# Patient Record
Sex: Male | Born: 1995 | Race: Black or African American | Hispanic: No | Marital: Single | Smoking: Never smoker
Health system: Southern US, Community
[De-identification: ages and names within clinical notes are randomized; demographics above are authoritative.]

## PROBLEM LIST (undated history)

## (undated) DIAGNOSIS — D649 Anemia, unspecified: Secondary | ICD-10-CM

## (undated) HISTORY — DX: Anemia, unspecified: D64.9

---
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
## 2015-06-07 ENCOUNTER — Other Ambulatory Visit: Payer: Self-pay | Admitting: Family Medicine

## 2015-06-07 ENCOUNTER — Ambulatory Visit
Admission: RE | Admit: 2015-06-07 | Discharge: 2015-06-07 | Disposition: A | Discharge status: Home / Self Care | Payer: 59 | Source: Ambulatory Visit | Attending: Family Medicine | Admitting: Family Medicine

## 2015-06-07 DIAGNOSIS — M25511 Pain in right shoulder: Secondary | ICD-10-CM | POA: Insufficient documentation

## 2015-06-07 DIAGNOSIS — R609 Edema, unspecified: Secondary | ICD-10-CM

## 2015-06-07 DIAGNOSIS — R52 Pain, unspecified: Secondary | ICD-10-CM

## 2015-08-31 DIAGNOSIS — Z87820 Personal history of traumatic brain injury: Secondary | ICD-10-CM | POA: Insufficient documentation

## 2015-08-31 DIAGNOSIS — J4531 Mild persistent asthma with (acute) exacerbation: Secondary | ICD-10-CM | POA: Insufficient documentation

## 2016-06-09 IMAGING — CR DG AC JOINTS*L*
1 series · 4 of 4 positions shown · non-contrast
Comparison: None

CLINICAL DATA: Right AC joint pain and swelling from ring following
an injury during football game 3 days ago.

EXAM:
LEFT ACROMIOCLAVICULAR JOINTS

[Series 1: dg ac joints · 0.14mm/px · 4 of 4 slices shown]
[im 1/4]
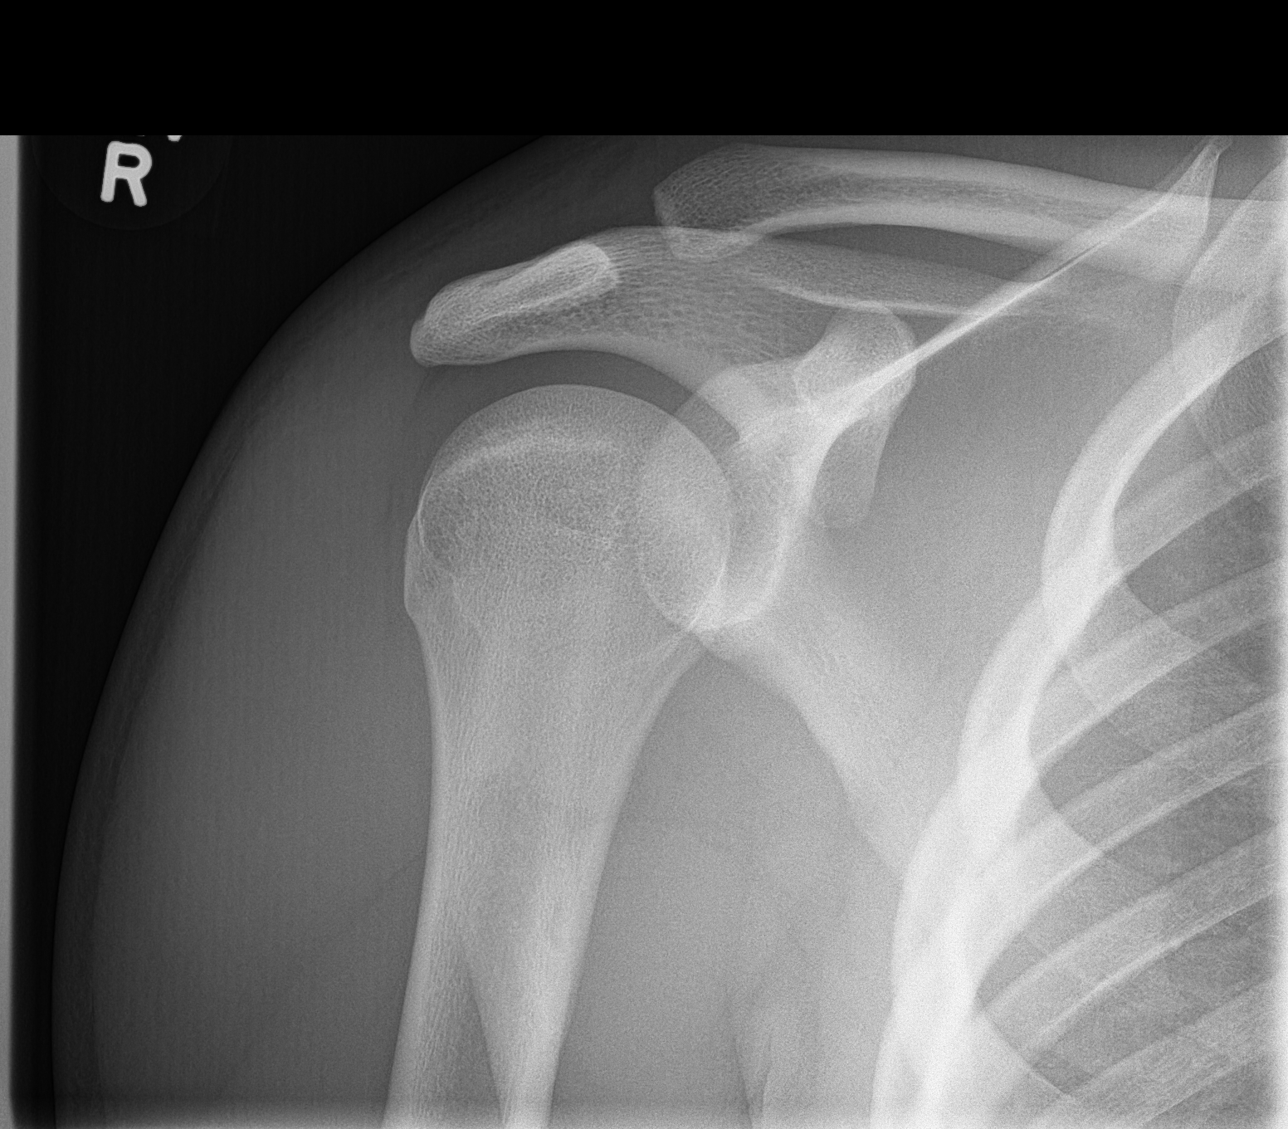
[im 2/4]
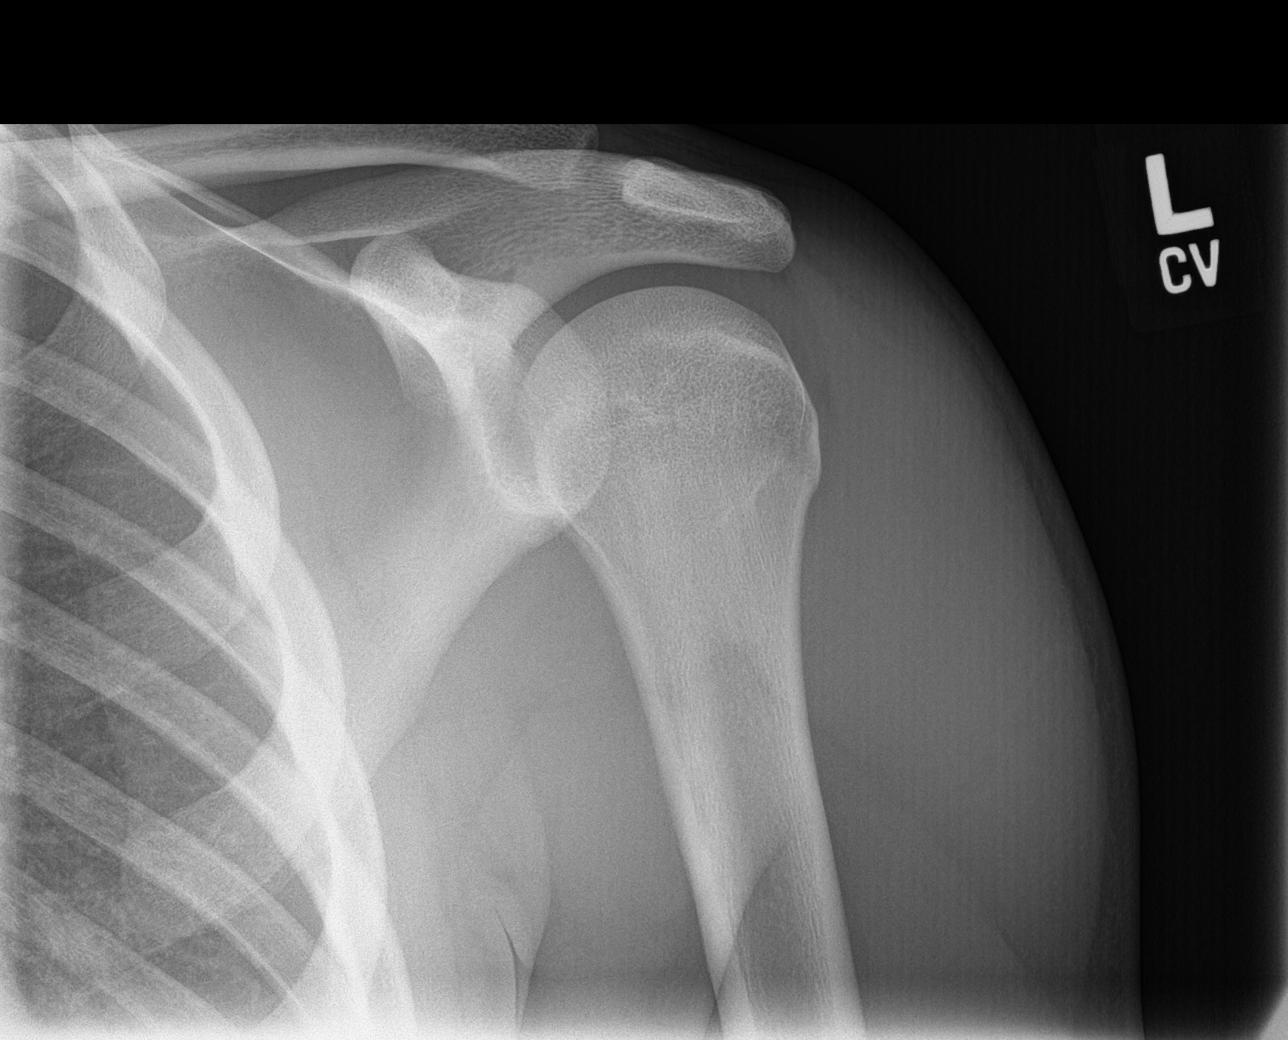
[im 3/4]
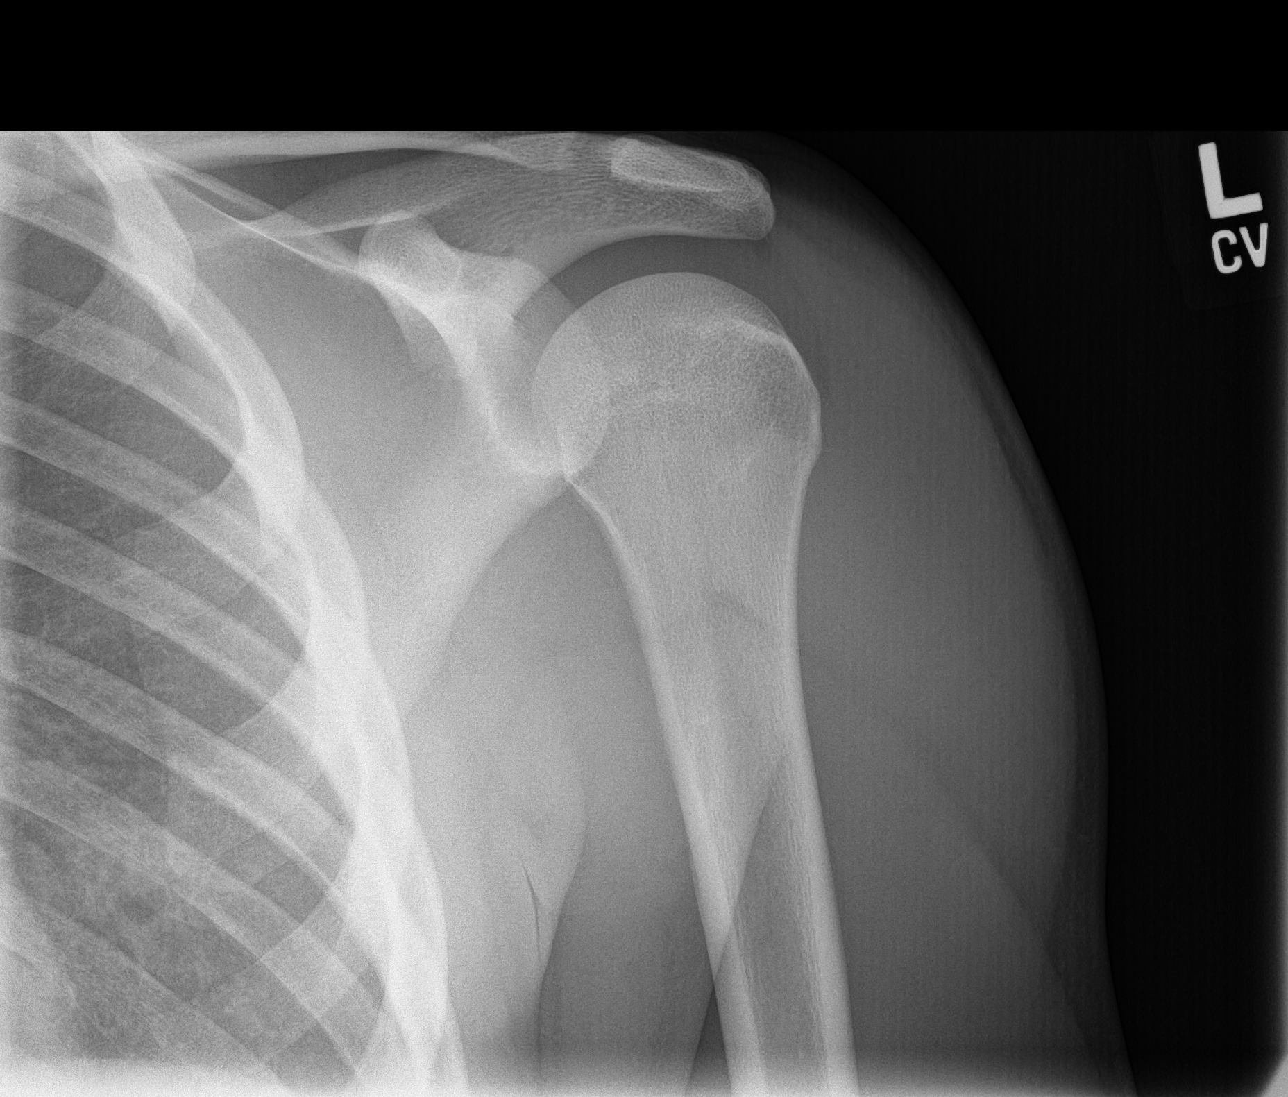
[im 4/4]
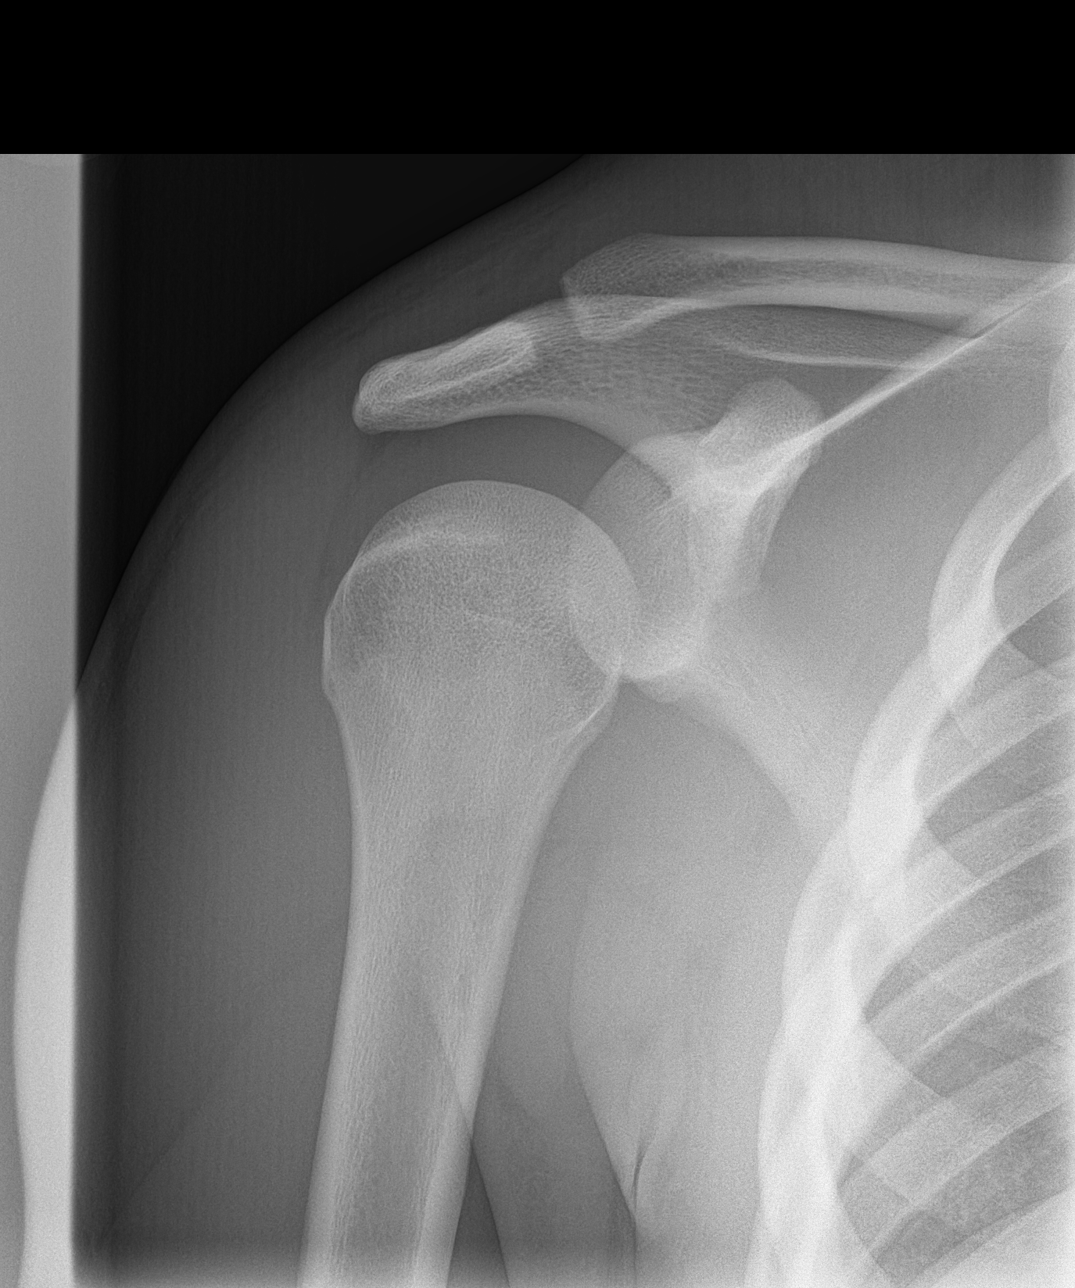

[4 of 4 positions shown; findings below may reference images not displayed]

FINDINGS: Images obtained without and with weights are reviewed. There is mild
widening of the right AC joint as compared to the left. However, no
significant change in the width without and with weights is
observed. The visualized portions of the right clavicle, of the
acromion, and remainder of the scapula are normal. The humeral head
exhibits no acute abnormality. The left AC joint is unremarkable.
IMPRESSION: There is mild widening of the right AC joint which may reflect a
sprain. There is no instability on the without and with weight
views. No acute fracture is observed.

## 2017-02-14 ENCOUNTER — Encounter: Payer: Self-pay | Admitting: Family Medicine

## 2017-02-14 ENCOUNTER — Ambulatory Visit (INDEPENDENT_AMBULATORY_CARE_PROVIDER_SITE_OTHER): Payer: 59 | Admitting: Family Medicine

## 2017-02-14 VITALS — BP 129/61 | HR 82 | Temp 98.7°F

## 2017-02-14 DIAGNOSIS — R0683 Snoring: Secondary | ICD-10-CM

## 2017-02-14 NOTE — Progress Notes (Signed)
Patient presents today with history of snoring and possible apneic spells for the last few years. Patient states that his mother and father both have sleep apnea. They have noticed him snoring very loud at times. Patient does state that he does feel tired throughout the day sometimes, admitting not having a restful sleep. He states he has no problem falling asleep. He does not take any sleep agents. He does however notice waking up during the night at times. He states that since she has been at [facility] he has had probably 10-15 pound weight gain. Patient does play football. He does admit to having seasonal allergies and asthma. He does takes Zyrtec and does use his albuterol inhaler at least once daily. He denies any history of any nasal fractures are chronic sinus problems.  ROS: Negative except mentioned above. Negative except mentioned above. GENERAL: NAD HEENT: large tongue and mildly large neck size, no pharyngeal erythema, no exudate, no erythema of TMs, no cervical LAD RESP: CTA B CARD: RRR NEURO: CN II-XII grossly intact   A/P: Snoring, family history of sleep apnea - will have patient see Pulmonary/Sleep to evaluate further and to decide on whether patient needs a sleep study. Patient will seek medical attention if has any acute problems. Patient appreciative and has no further questions or concerns.

## 2017-02-18 ENCOUNTER — Encounter: Payer: Self-pay | Admitting: *Deleted

## 2017-02-18 ENCOUNTER — Telehealth: Payer: Self-pay

## 2017-02-18 ENCOUNTER — Other Ambulatory Visit: Payer: Self-pay | Admitting: *Deleted

## 2017-02-18 NOTE — Telephone Encounter (Signed)
Appointment has been [facility] and Dr. Eliane DecreePatel's office.

## 2017-02-18 NOTE — Telephone Encounter (Signed)
Sheena from [facility] with Dr Eliane DecreePatel's office called stating they were told by Dr Belia HemanKasa that we could see patient this Friday for sleep apnea consult   Please call back they would know when patient can come in and see Dr Belia HemanKasa

## 2017-02-21 ENCOUNTER — Encounter: Payer: Self-pay | Admitting: *Deleted

## 2017-02-21 ENCOUNTER — Other Ambulatory Visit: Payer: Self-pay | Admitting: *Deleted

## 2017-02-21 DIAGNOSIS — M5430 Sciatica, unspecified side: Secondary | ICD-10-CM | POA: Insufficient documentation

## 2017-02-21 DIAGNOSIS — R279 Unspecified lack of coordination: Secondary | ICD-10-CM | POA: Insufficient documentation

## 2017-02-21 DIAGNOSIS — M25469 Effusion, unspecified knee: Secondary | ICD-10-CM | POA: Insufficient documentation

## 2017-02-21 DIAGNOSIS — S83509A Sprain of unspecified cruciate ligament of unspecified knee, initial encounter: Secondary | ICD-10-CM | POA: Insufficient documentation

## 2017-02-21 DIAGNOSIS — M542 Cervicalgia: Secondary | ICD-10-CM | POA: Insufficient documentation

## 2017-02-21 DIAGNOSIS — M2340 Loose body in knee, unspecified knee: Secondary | ICD-10-CM | POA: Insufficient documentation

## 2017-02-21 DIAGNOSIS — M4802 Spinal stenosis, cervical region: Secondary | ICD-10-CM | POA: Insufficient documentation

## 2017-02-21 DIAGNOSIS — M7650 Patellar tendinitis, unspecified knee: Secondary | ICD-10-CM | POA: Insufficient documentation

## 2017-02-21 DIAGNOSIS — M5020 Other cervical disc displacement, unspecified cervical region: Secondary | ICD-10-CM | POA: Insufficient documentation

## 2017-02-21 DIAGNOSIS — M25673 Stiffness of unspecified ankle, not elsewhere classified: Secondary | ICD-10-CM | POA: Insufficient documentation

## 2017-02-21 DIAGNOSIS — M23329 Other meniscus derangements, posterior horn of medial meniscus, unspecified knee: Secondary | ICD-10-CM | POA: Insufficient documentation

## 2017-02-21 DIAGNOSIS — M6281 Muscle weakness (generalized): Secondary | ICD-10-CM | POA: Insufficient documentation

## 2017-02-21 DIAGNOSIS — M79609 Pain in unspecified limb: Secondary | ICD-10-CM | POA: Insufficient documentation

## 2017-02-21 DIAGNOSIS — M5412 Radiculopathy, cervical region: Secondary | ICD-10-CM | POA: Insufficient documentation

## 2017-02-21 DIAGNOSIS — M25569 Pain in unspecified knee: Secondary | ICD-10-CM | POA: Insufficient documentation

## 2017-02-21 DIAGNOSIS — S93439A Sprain of tibiofibular ligament of unspecified ankle, initial encounter: Secondary | ICD-10-CM | POA: Insufficient documentation

## 2017-02-21 DIAGNOSIS — M25519 Pain in unspecified shoulder: Secondary | ICD-10-CM | POA: Insufficient documentation

## 2017-02-21 DIAGNOSIS — R609 Edema, unspecified: Secondary | ICD-10-CM | POA: Insufficient documentation

## 2017-02-21 DIAGNOSIS — S83419A Sprain of medial collateral ligament of unspecified knee, initial encounter: Secondary | ICD-10-CM | POA: Insufficient documentation

## 2017-02-21 DIAGNOSIS — M24419 Recurrent dislocation, unspecified shoulder: Secondary | ICD-10-CM | POA: Insufficient documentation

## 2017-02-21 DIAGNOSIS — M25579 Pain in unspecified ankle and joints of unspecified foot: Secondary | ICD-10-CM | POA: Insufficient documentation

## 2017-02-22 ENCOUNTER — Encounter: Payer: Self-pay | Admitting: Internal Medicine

## 2017-02-22 ENCOUNTER — Other Ambulatory Visit: Payer: Self-pay | Admitting: *Deleted

## 2017-02-22 ENCOUNTER — Ambulatory Visit (INDEPENDENT_AMBULATORY_CARE_PROVIDER_SITE_OTHER): Payer: 59 | Admitting: Internal Medicine

## 2017-02-22 VITALS — BP 140/80 | HR 57 | Resp 16 | Ht 71.0 in | Wt 232.0 lb

## 2017-02-22 DIAGNOSIS — J4520 Mild intermittent asthma, uncomplicated: Secondary | ICD-10-CM

## 2017-02-22 DIAGNOSIS — G4719 Other hypersomnia: Secondary | ICD-10-CM | POA: Diagnosis not present

## 2017-02-22 NOTE — Addendum Note (Signed)
Addended by: Alease FrameARTER, SONYA S on: 02/22/2017 12:15 PM   Modules accepted: Orders

## 2017-02-22 NOTE — Progress Notes (Signed)
Name: Austin Holland MRN: [id number] DOB: Apr 29, 1996     CONSULTATION DATE: 02/22/2017  REFERRING MD :  Allena Katz  CHIEF COMPLAINT:  Excessive daytime sleepiness     HISTORY OF PRESENT ILLNESS:  21 year old pleasant African-American male seen today for loud snoring and excessive daytime sleepiness  seen today for problems with sleep Patient has been having excessive daytime sleepiness Patient has been having extreme fatigue and tiredness, lack of energy Patient has been told that she has very  Loud snoring every night as per family Patient has been told that she struggles to breathe at night and gasps for air as per family  Patient is an Pensions consultant Patient weighs 225 pounds which has increased over the last couple years Patient has 17.5 inch neck  Epworth sleep score is 10  Patient also has a history of childhood asthma Patient is well controlled on his current medications of Flovent Patient also has a diagnosis of allergic rhinitis which is controlled by antihistamines and nasal spray Last nebulizer or rescue inhaler use was approximately one month ago Patient states he had prednisone approximately 2 years ago Patient denies having any eczema  No acute signs of asthma exacerbation at this time  no acute signs of infection at this time  no acute signs of heart failure at this time   PAST MEDICAL HISTORY :   has a past medical history of Anemia.  has no past surgical history on file. Prior to Admission medications   Medication Sig Start Date End Date Taking? Authorizing Provider  albuterol (ACCUNEB) 0.63 MG/3ML nebulizer solution Take 1 ampule by nebulization every 6 (six) hours as needed for wheezing.    [provider]  albuterol (PROVENTIL HFA;VENTOLIN HFA) 108 (90 Base) MCG/ACT inhaler Inhale 2 puffs into the lungs every 4 (four) hours as needed. 04/23/16   [provider]  cetirizine (ZYRTEC) 10 MG tablet Take 10 mg by mouth daily.     [provider]  fluticasone (FLONASE) 50 MCG/ACT nasal spray Place 1 spray into both nostrils 2 (two) times daily.    [provider]  fluticasone (FLOVENT HFA) 44 MCG/ACT inhaler Inhale 2 puffs into the lungs 2 (two) times daily.    [provider]   No Known Allergies  FAMILY HISTORY:  family history includes Asthma in his sister; Diabetes in his father, maternal grandmother, and mother; Kidney disease in his mother. SOCIAL HISTORY:  reports that he has quit smoking. His smoking use included Cigars. He has never used smokeless tobacco. He reports that he drinks alcohol. He reports that he does not use drugs.  REVIEW OF SYSTEMS:   Constitutional: Negative for fever, chills, weight loss,+ malaise/fatigue and diaphoresis.  HENT: Negative for hearing loss, ear pain, nosebleeds, congestion, sore throat, neck pain, tinnitus and ear discharge.   Eyes: Negative for blurred vision, double vision, photophobia, pain, discharge and redness.  Respiratory: Negative for cough, hemoptysis, sputum production, shortness of breath, wheezing and stridor.   Cardiovascular: Negative for chest pain, palpitations, orthopnea, claudication, leg swelling and PND.  Gastrointestinal: Negative for heartburn, nausea, vomiting, abdominal pain, diarrhea, constipation, blood in stool and melena.  Genitourinary: Negative for dysuria, urgency, frequency, hematuria and flank pain.  Musculoskeletal: Negative for myalgias, back pain, joint pain and falls.  Skin: Negative for itching and rash.  Neurological: Negative for dizziness, tingling, tremors, sensory change, speech change, focal weakness, seizures, loss of consciousness, weakness and headaches.  Endo/Heme/Allergies: Negative for environmental allergies and polydipsia. Does not bruise/bleed  easily.  ALL OTHER ROS ARE NEGATIVE   BP 140/80 (BP Location: Left Arm, Patient Position: Sitting, Cuff Size: Large)   Pulse (!) 57   Resp 16   Ht 5'  11" (1.803 m)   Wt 232 lb (105.2 kg)   SpO2 100%   BMI 32.36 kg/m    Physical Examination:   GENERAL:NAD, no fevers, chills, no weakness no fatigue HEAD: Normocephalic, atraumatic.  EYES: Pupils equal, round, reactive to light. Extraocular muscles intact. No scleral icterus.  MOUTH: Moist mucosal membrane.   EAR, NOSE, THROAT: Clear without exudates. No external lesions.  NECK: Supple. No thyromegaly. No nodules. No JVD.  PULMONARY:CTA B/L no wheezes, no crackles, no rhonchi CARDIOVASCULAR: S1 and S2. Regular rate and rhythm. No murmurs, rubs, or gallops. No edema.  GASTROINTESTINAL: Soft, nontender, nondistended. No masses. Positive bowel sounds.  MUSCULOSKELETAL: No swelling, clubbing, or edema. Range of motion full in all extremities.  NEUROLOGIC: Cranial nerves II through XII are intact. No gross focal neurological deficits.  SKIN: No ulceration, lesions, rashes, or cyanosis. Skin warm and dry. Turgor intact.  PSYCHIATRIC: Mood, affect within normal limits. The patient is awake, alert and oriented x 3. Insight, judgment intact.     No chest x-rays or CAT scans to be in reviewed   ASSESSMENT / PLAN: [age] year old pleasant African-American male seen today for excessive daytime sleepiness in the setting of loud snoring and respiratory difficulty at nighttime in the setting of well-controlled mild intermittent asthma associated with allergic rhinitis  #1 excessive daytime sleepiness and difficulty breathing at night Findings suggest high likelihood of obstructive sleep apnea Patient will need sleep study ASAP to confirm diagnosis  #2 mild intermittent asthma His asthma is well under control at this time with inhaled steroids-recommend continuing as prescribed Albuterol and nebulizer treatment as needed Avoid allergens at all costs  #3 allergic rhinitis Seems to be under control with antihistamines and steroid nasal spray  Patient satisfied with Plan of action and management.  All questions answered Follow-up after sleep study obtained  Lucie LeatherKurian David Marycruz Boehner, M.D.  [facility]  Medical Director [facility] Medical Director [facility]

## 2017-02-22 NOTE — Patient Instructions (Signed)
Patient will need sleep study to assess for sleep apnea

## 2017-03-26 ENCOUNTER — Encounter: Payer: Self-pay | Admitting: Internal Medicine

## 2017-03-26 DIAGNOSIS — G4719 Other hypersomnia: Secondary | ICD-10-CM

## 2017-03-30 DIAGNOSIS — G4733 Obstructive sleep apnea (adult) (pediatric): Secondary | ICD-10-CM | POA: Diagnosis not present

## 2017-04-03 ENCOUNTER — Other Ambulatory Visit: Payer: Self-pay | Admitting: Internal Medicine

## 2017-04-03 ENCOUNTER — Telehealth: Payer: Self-pay | Admitting: Internal Medicine

## 2017-04-03 DIAGNOSIS — G4733 Obstructive sleep apnea (adult) (pediatric): Secondary | ICD-10-CM

## 2017-04-03 NOTE — Telephone Encounter (Signed)
Returned call to patient he was not available. Patient did return call and wanted to know stage of OSA. Patient informed mild sleep apnea. Patient had no further question.

## 2017-04-03 NOTE — Telephone Encounter (Signed)
Pt would like a call to discuss sleep study results. He has a question.

## 2017-04-03 NOTE — Progress Notes (Signed)
Patient notified of results of sleep study. Orders entered. Pt made aware that he needs to call office and schedule f/u appt once machine is picked up. Nothing further needed.

## 2017-04-23 ENCOUNTER — Encounter: Payer: Self-pay | Admitting: Internal Medicine

## 2017-05-13 ENCOUNTER — Encounter: Payer: Self-pay | Admitting: Internal Medicine

## 2017-05-16 ENCOUNTER — Encounter: Payer: Self-pay | Admitting: Internal Medicine

## 2017-06-21 ENCOUNTER — Encounter: Payer: Self-pay | Admitting: Family Medicine

## 2017-06-21 ENCOUNTER — Ambulatory Visit (INDEPENDENT_AMBULATORY_CARE_PROVIDER_SITE_OTHER): Payer: 59 | Admitting: Family Medicine

## 2017-06-21 VITALS — Temp 98.9°F

## 2017-06-21 DIAGNOSIS — L089 Local infection of the skin and subcutaneous tissue, unspecified: Secondary | ICD-10-CM

## 2017-06-21 MED ORDER — SULFAMETHOXAZOLE-TRIMETHOPRIM 800-160 MG PO TABS: 1.0000 | ORAL_TABLET | Freq: Two times a day (BID) | ORAL | 0 refills | Status: AC

## 2017-06-21 NOTE — Progress Notes (Signed)
Patient presents today with 3 small wounds on his right leg. Patient states that he believes these wounds started a few weeks ago when he got turf burn during a game. He states that the areas have started to drain some clear/pussy fluid. He denies any fever or chills. He denies any significant pain around the area. He denies any other wounds anywhere else. He denies any history of MRSA in the past. He has been putting some topical antibiotic on the area that was given to him by the training staff.  ROS: Negative except mentioned above. Vitals as per Epic. GENERAL: NAD RESP: CTA B CARD: RRR SKIN: Area of turf burn along the right lower leg has healed and the skin is hypopigmented in the area, 3 small open wounds noted on the right lower leg, 1 of these wounds is located in the patella tendon region, the other 2 are on the anterior lateral aspect of the right lower leg, the size of the wounds are all less than 5 mm, there is minimal clear fluid from the wound when expressed, no necrotic areas noted, no significant erythema or warmth around the area, no streaks noted NEURO: CN II-XII grossly intact   A/P: Skin infection/wound right lower leg - will treat with Bactrim DS, wash area with antibacterial soap and water twice daily, keep area clean and dry, keep covered when in the weight room and playing football, wash hands frequently, monitor site daily, seek medical attention if symptoms persist or worsen as discussed.

## 2017-09-10 ENCOUNTER — Ambulatory Visit: Payer: 59 | Admitting: Internal Medicine

## 2017-09-11 ENCOUNTER — Encounter: Payer: Self-pay | Admitting: Internal Medicine
